# Patient Record
Sex: Male | Born: 1985 | Race: Black or African American | Hispanic: No | Marital: Married | Smoking: Never smoker
Health system: Southern US, Community
[De-identification: ages and names within clinical notes are randomized; demographics above are authoritative.]

## PROBLEM LIST (undated history)

## (undated) DIAGNOSIS — K219 Gastro-esophageal reflux disease without esophagitis: Secondary | ICD-10-CM

## (undated) HISTORY — DX: Gastro-esophageal reflux disease without esophagitis: K21.9

---
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
## 2015-12-23 ENCOUNTER — Ambulatory Visit (INDEPENDENT_AMBULATORY_CARE_PROVIDER_SITE_OTHER): Payer: 59 | Admitting: Emergency Medicine

## 2015-12-23 ENCOUNTER — Ambulatory Visit (INDEPENDENT_AMBULATORY_CARE_PROVIDER_SITE_OTHER): Payer: 59

## 2015-12-23 VITALS — BP 138/86 | HR 77 | Temp 98.3°F | Resp 18 | Ht 71.5 in | Wt 288.2 lb

## 2015-12-23 DIAGNOSIS — J069 Acute upper respiratory infection, unspecified: Secondary | ICD-10-CM

## 2015-12-23 DIAGNOSIS — J0100 Acute maxillary sinusitis, unspecified: Secondary | ICD-10-CM

## 2015-12-23 DIAGNOSIS — R059 Cough, unspecified: Secondary | ICD-10-CM

## 2015-12-23 DIAGNOSIS — E669 Obesity, unspecified: Secondary | ICD-10-CM

## 2015-12-23 DIAGNOSIS — R05 Cough: Secondary | ICD-10-CM

## 2015-12-23 LAB — POCT CBC
Granulocyte percent: 56.3 %G (ref 37–80)
HCT, POC: 46.2 % (ref 43.5–53.7)
Hemoglobin: 16.2 g/dL (ref 14.1–18.1)
Lymph, poc: 3.3 (ref 0.6–3.4)
MCH, POC: 29.8 pg (ref 27–31.2)
MCHC: 35 g/dL (ref 31.8–35.4)
MCV: 85 fL (ref 80–97)
MID (cbc): 0.3 (ref 0–0.9)
MPV: 7.3 fL (ref 0–99.8)
POC Granulocyte: 4.6 (ref 2–6.9)
POC LYMPH PERCENT: 39.8 %L (ref 10–50)
POC MID %: 3.9 %M (ref 0–12)
Platelet Count, POC: 218 10*3/uL (ref 142–424)
RBC: 5.43 M/uL (ref 4.69–6.13)
RDW, POC: 14.2 %
WBC: 8.2 10*3/uL (ref 4.6–10.2)

## 2015-12-23 LAB — GLUCOSE, POCT (MANUAL RESULT ENTRY): POC Glucose: 92 mg/dl (ref 70–99)

## 2015-12-23 MED ORDER — AMOXICILLIN-POT CLAVULANATE 875-125 MG PO TABS: 1.0000 | ORAL_TABLET | Freq: Two times a day (BID) | ORAL | Status: DC

## 2015-12-23 MED ORDER — AZELASTINE HCL 0.1 % NA SOLN: 2.0000 | Freq: Two times a day (BID) | NASAL | Status: DC

## 2015-12-23 NOTE — Progress Notes (Addendum)
Patient ID: Jack Wiley, male   DOB: 07/09/1986, 30 y.o.   MRN: [id number]     By signing my name below, I, Littie Deedsichard Sun, attest that this documentation has been prepared under the direction and in the presence of Lesle ChrisSteven Teleah Villamar, MD.  Electronically Signed: Littie Deedsichard Sun, Medical Scribe. 12/23/2015. 9:30 AM.   Chief Complaint:  Chief Complaint  Patient presents with  . Sinusitis    x1 month, facial pressure, headache, and fever.   . Cough    x1 month     HPI: Jack Wiley is a 30 y.o. male [facility] complaining of gradual onset, productive cough of yellowish sputum that started 1 month ago. Patient reports having associated sinus congestion, sinus pressure, rhinorrhea with clear to yellow/green sputum headache, nausea, vomiting, and subjective fever. He has tried Mucinex and Robitussin for his symptoms. Patient denies smoking and history of asthma. He also denies any new pets or changes in his environment. He does have a history of pneumonia. He does not have any known medical problems, although today is this first time he has seen a doctor in 6 years as he recently acquired insurance.  Patient works 3rd shift in [employer].  Past Medical History  Diagnosis Date  . GERD (gastroesophageal reflux disease)    History reviewed. No pertinent past surgical history. Social History   Social History  . Marital Status: Married    Spouse Name: N/A  . Number of Children: N/A  . Years of Education: N/A   Social History Main Topics  . Smoking status: Never Smoker   . Smokeless tobacco: None  . Alcohol Use: 0.0 oz/week    0 Standard drinks or equivalent per week  . Drug Use: No  . Sexual Activity: Not Asked   Other Topics Concern  . None   Social History Narrative  . None   History reviewed. No pertinent family history. No Known Allergies Prior to Admission medications   Not on File     ROS: The patient denies night sweats, unintentional weight loss, chest  pain, palpitations, wheezing, dyspnea on exertion, abdominal pain, dysuria, hematuria, melena, numbness, weakness, or tingling.   All other systems have been reviewed and were otherwise negative with the exception of those mentioned in the HPI and as above.    PHYSICAL EXAM: Filed Vitals:   12/23/15 0912  BP: 138/86  Pulse: 77  Temp: 98.3 F (36.8 C)  Resp: 18   Body mass index is 39.64 kg/(m^2).   General: Alert, no acute distress. Not ill appearing.  HEENT:  Normocephalic, atraumatic, oropharynx patent. Nasal congestion. Ears and throat normal. Eye: EOMI, PEERLDC Cardiovascular:  Regular rate and rhythm, no rubs murmurs or gallops.  No Carotid bruits, radial pulse intact. No pedal edema.  Respiratory: Clear to auscultation bilaterally.  No wheezes, rales, or rhonchi.  No cyanosis, no use of accessory musculature Abdominal: No organomegaly, abdomen is soft and non-tender, positive bowel sounds.  No masses. Musculoskeletal: Gait intact. No edema, tenderness Skin: No rashes. Neurologic: Facial musculature symmetric. Psychiatric: Patient acts appropriately throughout our interaction. Lymphatic: No cervical or submandibular lymphadenopathy     LABS: Results for orders placed or performed in visit on 12/23/15  POCT glucose (manual entry)  Result Value Ref Range   POC Glucose 92 70 - 99 mg/dl  POCT CBC  Result Value Ref Range   WBC 8.2 4.6 - 10.2 K/uL   Lymph, poc 3.3 0.6 - 3.4   POC LYMPH PERCENT  39.8 10 - 50 %L   MID (cbc) 0.3 0 - 0.9   POC MID % 3.9 0 - 12 %M   POC Granulocyte 4.6 2 - 6.9   Granulocyte percent 56.3 37 - 80 %G   RBC 5.43 4.69 - 6.13 M/uL   Hemoglobin 16.2 14.1 - 18.1 g/dL   HCT, POC 16.1 09.6 - 53.7 %   MCV 85.0 80 - 97 fL   MCH, POC 29.8 27 - 31.2 pg   MCHC 35.0 31.8 - 35.4 g/dL   RDW, POC 04.5 %   Platelet Count, POC 218 142 - 424 K/uL   MPV 7.3 0 - 99.8 fL     EKG/XRAY:   Primary read interpreted by Dr. Cleta Alberts at [facility]. Chest x-ray is  normal. Sinus films show an occluded right maxillary sinus.   ASSESSMENT/PLAN: I suspect symptoms are secondary to sinusitis. He was placed on Astelin nasal spray along with 2 weeks of Augmentin. Patient to return to clinic to see me if not well in 2 weeks. He will need a note for 2 days.I personally performed the services described in this documentation, which was scribed in my presence. The recorded information has been reviewed and is accurate.    Gross sideeffects, risk and benefits, and alternatives of medications d/w patient. Patient is aware that all medications have potential sideeffects and we are unable to predict every sideeffect or drug-drug interaction that may occur.  Lesle Chris MD 12/23/2015 9:30 AM

## 2015-12-23 NOTE — Patient Instructions (Signed)

## 2017-01-05 ENCOUNTER — Ambulatory Visit (HOSPITAL_COMMUNITY)
Admission: EM | Admit: 2017-01-05 | Discharge: 2017-01-05 | Disposition: A | Discharge status: Home / Self Care | Payer: 59 | Attending: Family Medicine | Admitting: Family Medicine

## 2017-01-05 ENCOUNTER — Encounter (HOSPITAL_COMMUNITY): Payer: Self-pay | Admitting: Emergency Medicine

## 2017-01-05 DIAGNOSIS — B349 Viral infection, unspecified: Secondary | ICD-10-CM

## 2017-01-05 MED ORDER — PHENYLEPHRINE-CHLORPHEN-DM 10-4-12.5 MG/5ML PO LIQD: 5.0000 mL | ORAL | 0 refills | Status: AC | PRN

## 2017-01-05 MED ORDER — TRAMADOL HCL 50 MG PO TABS: ORAL_TABLET | ORAL | 0 refills | Status: AC

## 2017-01-05 NOTE — ED Triage Notes (Signed)
[facility] with a complaint of a headache with light and sound sensitivity and general body aches x 3 days.

## 2017-01-05 NOTE — Discharge Instructions (Addendum)
Drink any fluids stay well-hydrated. Take medication as directed. These may cause drowsiness. Rest  instructions that are pre-written along with your papers.

## 2017-01-05 NOTE — ED Provider Notes (Signed)
CSN: [id number]     Arrival date & time 01/05/17  1041 History   First MD Initiated Contact with Patient 01/05/17 1102     Chief Complaint  Patient presents with  . 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 Temperature currently is 98.2. States he had a flu shot this season.      Past Medical History:  Diagnosis Date  . GERD (gastroesophageal reflux disease)    History reviewed. No pertinent surgical history. History reviewed. No pertinent family history. Social History  Substance Use Topics  . Smoking status: Never Smoker  . Smokeless tobacco: Not on file  . Alcohol use 0.0 oz/week    Review of Systems  Constitutional: Positive for activity change, fatigue and fever.  HENT: Positive for congestion, postnasal drip, rhinorrhea and sore throat.   Respiratory: Positive for cough. Negative for shortness of breath and wheezing.   Cardiovascular: Negative for chest pain.  Gastrointestinal: Negative.   Skin: Negative.     Allergies  Patient has no known allergies.  Home Medications   Prior to Admission medications   Medication Sig Start Date End Date Taking? Authorizing Provider  Phenylephrine-Chlorphen-DM 09-24-11.5 MG/5ML LIQD Take 5 mLs by mouth every 4 (four) hours as needed. 01/05/17   Hayden Rasmussen, NP  traMADol (ULTRAM) 50 MG tablet 1-2 tabs po q 6 hr prn pain Maximum dose= 8 tablets per day 01/05/17   Hayden Rasmussen, NP   Meds Ordered and Administered this Visit  Medications - No data to display  BP (!) 160/104 (BP Location: Left Arm)   Pulse 114   Temp 98.2 F (36.8 C) (Oral)   Resp 16   SpO2 97%  No data found.   Physical Exam  Constitutional: He is  oriented to person, place, and time. He appears well-developed and well-nourished. No distress.  HENT:  Head: Normocephalic and atraumatic.  Right Ear: External ear normal.  Left Ear: External ear normal.  Mouth/Throat: No oropharyngeal exudate.  Bilateral TMs are clear, normal light reflex, no erythema or bulging.  Oropharynx with minor erythema otherwise no exudates or swelling.  Eyes: EOM are normal.  Neck: Normal range of motion. Neck supple.  Cardiovascular: Normal rate, regular rhythm, normal heart sounds and intact distal pulses.   Pulmonary/Chest: Effort normal. No respiratory distress. He has no wheezes. He has no rales.  Lungs are perfectly clear. Good chest expansion good air movement. No adventitious sounds wheezes, rales. No cough spasms during inspiration.   Musculoskeletal: Normal range of motion. He exhibits no edema or deformity.  Lymphadenopathy:    He has no cervical adenopathy.  Neurological: He is alert and oriented to person, place, and time.  Skin: Skin is warm and dry. No rash noted.  Psychiatric: He has a normal mood and affect.  Nursing note and vitals reviewed.   Urgent Care Course   Clinical Course     Procedures (including critical care time)  Labs Review Labs Reviewed - No data to display  Imaging Review No results found.   Visual Acuity Review  Right Eye Distance:   Left Eye Distance:   Bilateral Distance:    Right Eye Near:   Left Eye Near:    Bilateral Near:         MDM  1. Acute viral syndrome     Drink plenty fluids stay well-hydrated. Take medication as directed. These may cause drowsiness. Rest  instructions that are pre-written along with your papers. Meds ordered this encounter  Medications  . traMADol (ULTRAM) 50 MG tablet    Sig: 1-2 tabs po q 6 hr prn pain Maximum dose= 8 tablets per day    Dispense:  15 tablet    Refill:  0    Order Specific Question:   Supervising Provider    Answer:   Linna HoffKINDL, JAMES D [phone][[id number]]   . Phenylephrine-Chlorphen-DM 09-24-11.5 MG/5ML LIQD    Sig: Take 5 mLs by mouth every 4 (four) hours as needed.    Dispense:  120 mL    Refill:  0    Order Specific Question:   Supervising Provider    Answer:   Linna HoffKINDL, JAMES D [[id number]]     Hayden Rasmussenavid Arren Laminack, NP 01/05/17 1121    Hayden Rasmussenavid Mikelle Myrick, NP 01/05/17 1149    Hayden Rasmussenavid Doylene Splinter, NP 01/05/17 1151

## 2019-06-18 ENCOUNTER — Emergency Department (HOSPITAL_COMMUNITY)
Admission: EM | Admit: 2019-06-18 | Discharge: 2019-06-18 | Disposition: A | Discharge status: Home / Self Care | Payer: Self-pay | Attending: Emergency Medicine | Admitting: Emergency Medicine

## 2019-06-18 ENCOUNTER — Other Ambulatory Visit: Payer: Self-pay

## 2019-06-18 ENCOUNTER — Encounter (HOSPITAL_COMMUNITY): Payer: Self-pay | Admitting: Emergency Medicine

## 2019-06-18 ENCOUNTER — Emergency Department (HOSPITAL_COMMUNITY): Payer: Self-pay

## 2019-06-18 DIAGNOSIS — L03115 Cellulitis of right lower limb: Secondary | ICD-10-CM | POA: Insufficient documentation

## 2019-06-18 MED ORDER — OXYCODONE-ACETAMINOPHEN 5-325 MG PO TABS
1.0000 | ORAL_TABLET | Freq: Once | ORAL | Status: AC
  Administered 2019-06-18: 1 via ORAL
  Filled 2019-06-18: qty 1

## 2019-06-18 MED ORDER — DOXYCYCLINE HYCLATE 100 MG PO CAPS: 100.0000 mg | ORAL_CAPSULE | Freq: Two times a day (BID) | ORAL | 0 refills | Status: AC

## 2019-06-18 MED ORDER — LIDOCAINE HCL 2 % IJ SOLN
10.0000 mL | Freq: Once | INTRAMUSCULAR | Status: AC
  Administered 2019-06-18: 200 mg via INTRADERMAL
  Filled 2019-06-18: qty 20

## 2019-06-18 NOTE — Discharge Instructions (Addendum)
You were given a prescription for antibiotics. Please take the antibiotic prescription fully.   Please follow up with your primary care provider within 5-7 days for re-evaluation of your symptoms. If you do not have a primary care provider, information for a healthcare clinic has been provided for you to make arrangements for follow up care.  Please return to the emergency room immediately if you experience any new or worsening symptoms or any symptoms that indicate worsening infection such as fevers, increased redness/swelling/pain, warmth, or drainage from the affected area.    

## 2019-06-18 NOTE — ED Provider Notes (Signed)
[facility] EM[facility]ENC[facility] Provider Note   CSN: [id number] Arrival date & time: 06/18/19  1223    History   Chief Complaint Chief Complaint  Patient presents with   Leg Pain    HPI Jack Wiley is a 33 y.o. male.     HPI   Patient is a [age] old male with a history of GERD who presents emergency department today complaining of redness and pain to the right lower extremity.  States he was tubing in a river yesterday when he cut his leg on either a rock or a tree branch.  He woke up this morning and had severe pain to the anterior right lower extremity.  He was seen at urgent care who did an x-ray showing a possible lucency in the soft tissue concerning for foreign body.  There was no underlying fracture.  I&D was attempted at urgent care without success of foreign body removal therefore he was sent here for further evaluation.  He denies any fevers.  Past Medical History:  Diagnosis Date   GERD (gastroesophageal reflux disease)     There are no active problems to display for this patient.   History reviewed. No pertinent surgical history.      Home Medications    Prior to Admission medications   Medication Sig Start Date End Date Taking? Authorizing Provider  doxycycline (VIBRAMYCIN) 100 MG capsule Take 1 capsule (100 mg total) by mouth 2 (two) times daily for 7 days. 06/18/19 06/25/19  Raechell Singleton S, PA-C  Phenylephrine-Chlorphen-DM 09-24-11.5 MG/5ML LIQD Take 5 mLs by mouth every 4 (four) hours as needed. 01/05/17   Hayden RasmussenMabe, David, NP  traMADol (ULTRAM) 50 MG tablet 1-2 tabs po q 6 hr prn pain Maximum dose= 8 tablets per day 01/05/17   Hayden RasmussenMabe, David, NP    Family History No family history on file.  Social History Social History   Tobacco Use   Smoking status: Never Smoker  Substance Use Topics   Alcohol use: Yes    Alcohol/week: 0.0 standard drinks   Drug use: No     Allergies   Patient has no known allergies.   Review of  Systems Review of Systems  Constitutional: Negative for fever.  Musculoskeletal:       Rle pain  Skin: Positive for color change and wound.     Physical Exam Updated Vital Signs BP (!) 148/94 (BP Location: Right Arm)    Pulse (!) 59    Temp 99 F (37.2 C) (Oral)    Resp 16    Ht 5\' 11"  (1.803 m)    Wt 113.9 kg    SpO2 100%    BMI 35.01 kg/m   Physical Exam Constitutional:      General: He is not in acute distress.    Appearance: He is well-developed.  Eyes:     Conjunctiva/sclera: Conjunctivae normal.  Cardiovascular:     Rate and Rhythm: Normal rate.  Pulmonary:     Effort: Pulmonary effort is normal.  Musculoskeletal:     Comments: TTP and swelling to the mid anterior shin.  5 cm x 4 cm area of erythema to the right lower shin with central open wound from prior I&D completed prior to arrival.  No calf tenderness or edema.  No lymphangitis.  Neurovascularly intact distally.  Skin:    General: Skin is warm and dry.  Neurological:     Mental Status: He is alert and oriented to person, place, and time.  ED Treatments / Results  Labs (all labs ordered are listed, but only abnormal results are displayed) Labs Reviewed - No data to display  EKG None  Radiology Dg Tibia/fibula Right  Result Date: 06/18/2019 CLINICAL DATA:  Pain and swelling after hitting leg upon rock EXAM: RIGHT TIBIA AND FIBULA - 2 VIEW COMPARISON:  None. FINDINGS: Frontal and lateral views were obtained. No fracture or dislocation. No abnormal periosteal reaction. Several apparent phleboliths are noted anterior to the proximal and mid tibia. No soft tissue air or radiopaque foreign body demonstrable. Joint spaces appear normal. No evident knee or ankle joint effusion. There is a spur along the anterior superior patella. IMPRESSION: No fracture or dislocation. No evident radiopaque foreign body. Presumed phleboliths anterior to the proximal and mid tibia regions. Joint spaces appear unremarkable.  Calcification along the anterior superior patella is likely due to distal quadriceps tendinosis. Electronically Signed   By: Bretta BangWilliam  Woodruff III M.D.   On: 06/18/2019 14:23    Procedures Procedures (including critical care time)  Medications Ordered in ED Medications  lidocaine (XYLOCAINE) 2 % (with pres) injection 200 mg (200 mg Intradermal Given 06/18/19 1321)  oxyCODONE-acetaminophen (PERCOCET/ROXICET) 5-325 MG per tablet 1 tablet (1 tablet Oral Given 06/18/19 1344)     Initial Impression / Assessment and Plan / ED Course  I have reviewed the triage vital signs and the nursing notes.  Pertinent labs & imaging results that were available during my care of the patient were reviewed by me and considered in my medical decision making (see chart for details).   Final Clinical Impressions(s) / ED Diagnoses   Final diagnoses:  Cellulitis of right lower extremity   Patient is a [age] old male with a history of GERD who presents emergency department today complaining of redness and pain to the right lower extremity.  States he was tubing in a river yesterday when he cut his leg on either a rock or a tree branch.  He woke up this morning and had severe pain to the anterior right lower extremity.  He was seen at urgent care who did an x-ray showing a possible lucency in the soft tissue concerning for foreign body.  There was no underlying fracture.  I&D was attempted at urgent care without success of foreign body removal therefore he was sent here for further evaluation.  He denies any fevers.  Patient afebrile with reassuring vital signs.  Nontoxic nonseptic appearing.  On exam, TTP and swelling to the mid anterior shin.  5 cm x 4 cm area of erythema to the right lower shin with central open wound from prior I&D completed prior to arrival.  No calf tenderness or edema.  No lymphangitis.  Neurovascularly intact distally.   Xray of tib/fib without fracture or dislocation. No evident radiopaque  foreign body. Presumed phleboliths anterior to the proximal and mid tibia regions. Joint spaces appear unremarkable. Calcification along the anterior superior patella is likely due to distal quadriceps tendinosis.   Discussed the results of the x-ray with the patient.  Discussed possibility of persistent foreign body, attempted to probe the wound however was unable to feel or visualize a foreign body.  Avoided further probing to avoid further tissue damage.  Will keep wound open given contamination and possible foreign body.  I pressure irrigated the wound copiously with a liter of normal saline.  Will start patient on antibiotics.  A border was drawn around the area of erythema.  Patient was advised to start antibiotics and was  advised on wound care.  Advised to follow-up and return to the ER for any worsening signs of infection.  He voices understanding of the plan and reasons to return.  All questions answered.  Patient stable for discharge.  ED Discharge Orders         Ordered    doxycycline (VIBRAMYCIN) 100 MG capsule  2 times daily     06/18/19 [address], PA-C 06/18/19 1512    Isla Pence, MD 06/18/19 1521

## 2019-06-18 NOTE — ED Triage Notes (Signed)
Pt went to the [facility]. While tubing he hit a rock with his right leg. Pt went to urgent care today because he could not walk on it d/t pain.  Pt endorses drainage from wound.

## 2019-06-18 NOTE — ED Notes (Signed)
Patient transported to X-ray 

## 2019-06-18 NOTE — ED Notes (Signed)
Patient verbalizes understanding of discharge instructions . Opportunity for questions and answers were provided . Armband removed by staff ,Pt discharged from ED. W/C  offered at D/C  and Declined W/C at D/C and was escorted to lobby by RN.  

## 2019-12-07 IMAGING — CR RIGHT TIBIA AND FIBULA - 2 VIEW
4 series · 4 of 4 positions shown · non-contrast
Comparison: None.

CLINICAL DATA: Pain and swelling after hitting leg upon rock

EXAM:
RIGHT TIBIA AND FIBULA - 2 VIEW

[tibia ap (1 of 2)]
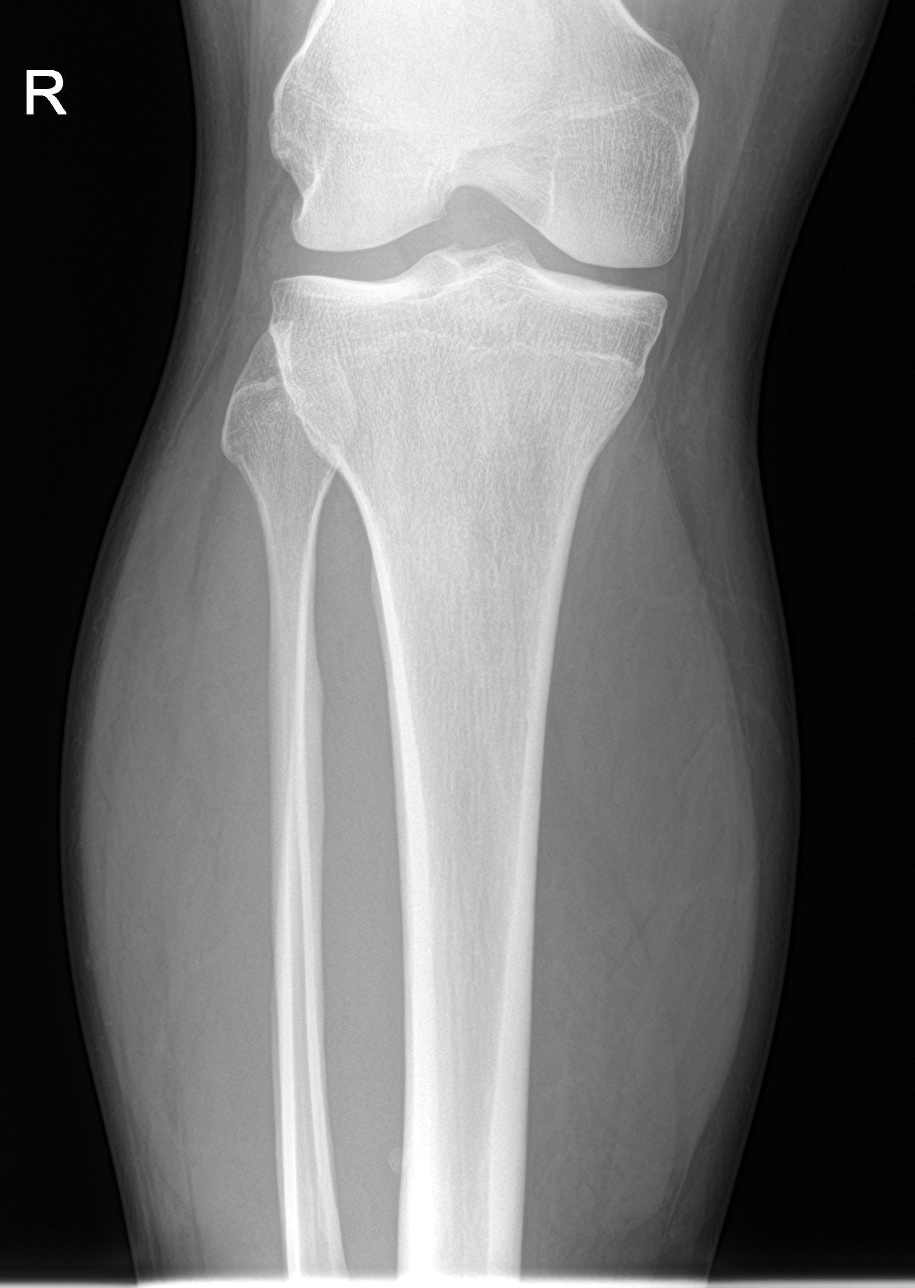

[tibia ap (2 of 2)]
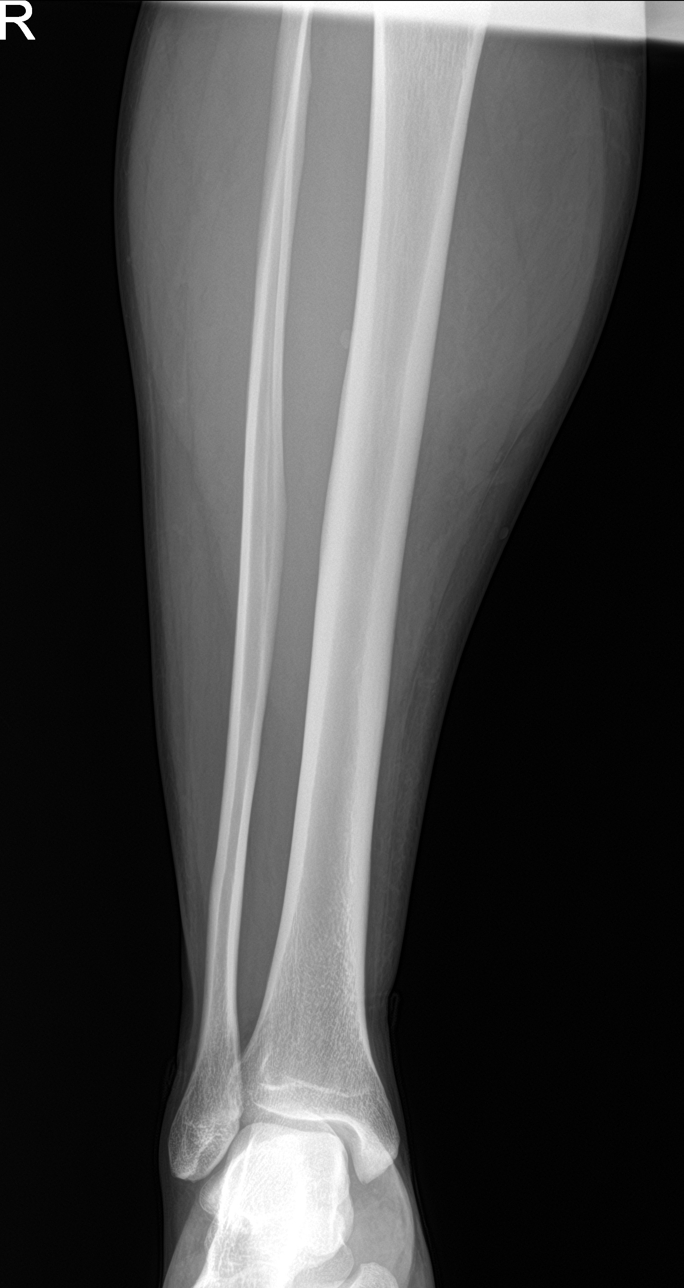

[tibia lat (1 of 2)]
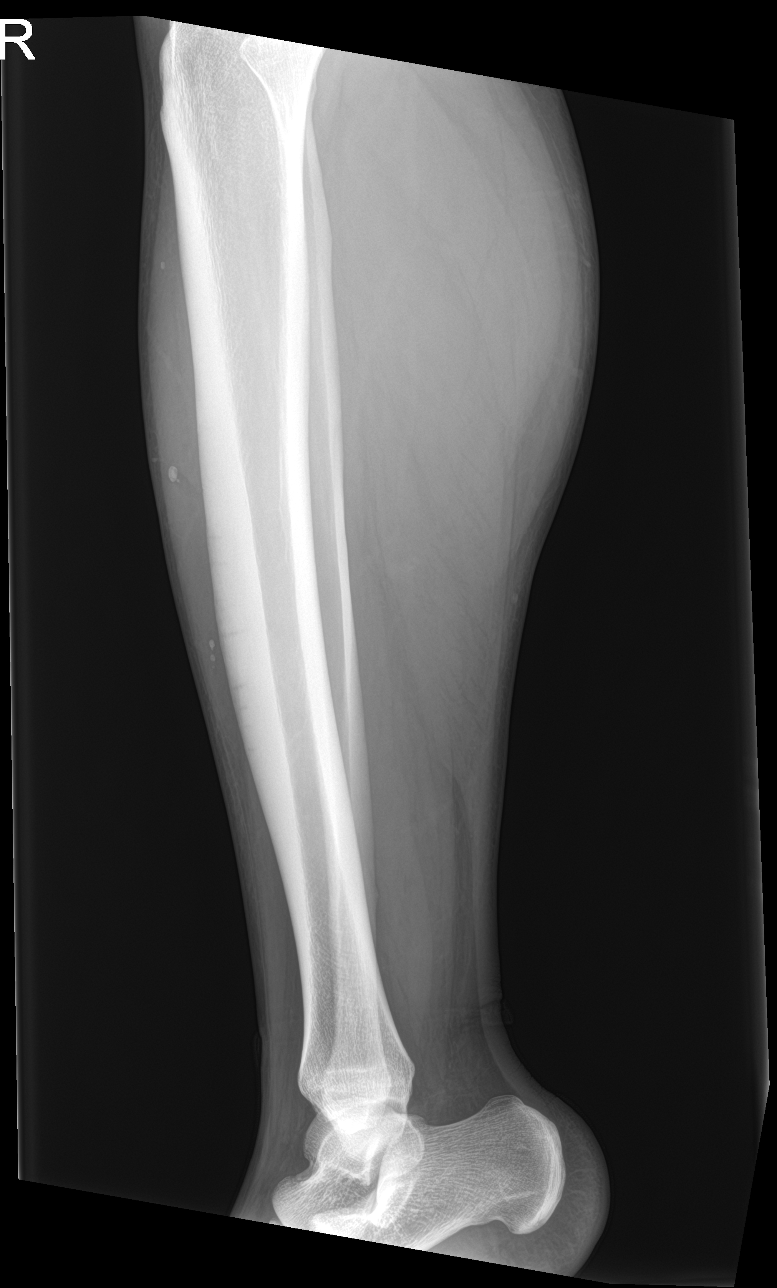

[tibia lat (2 of 2)]
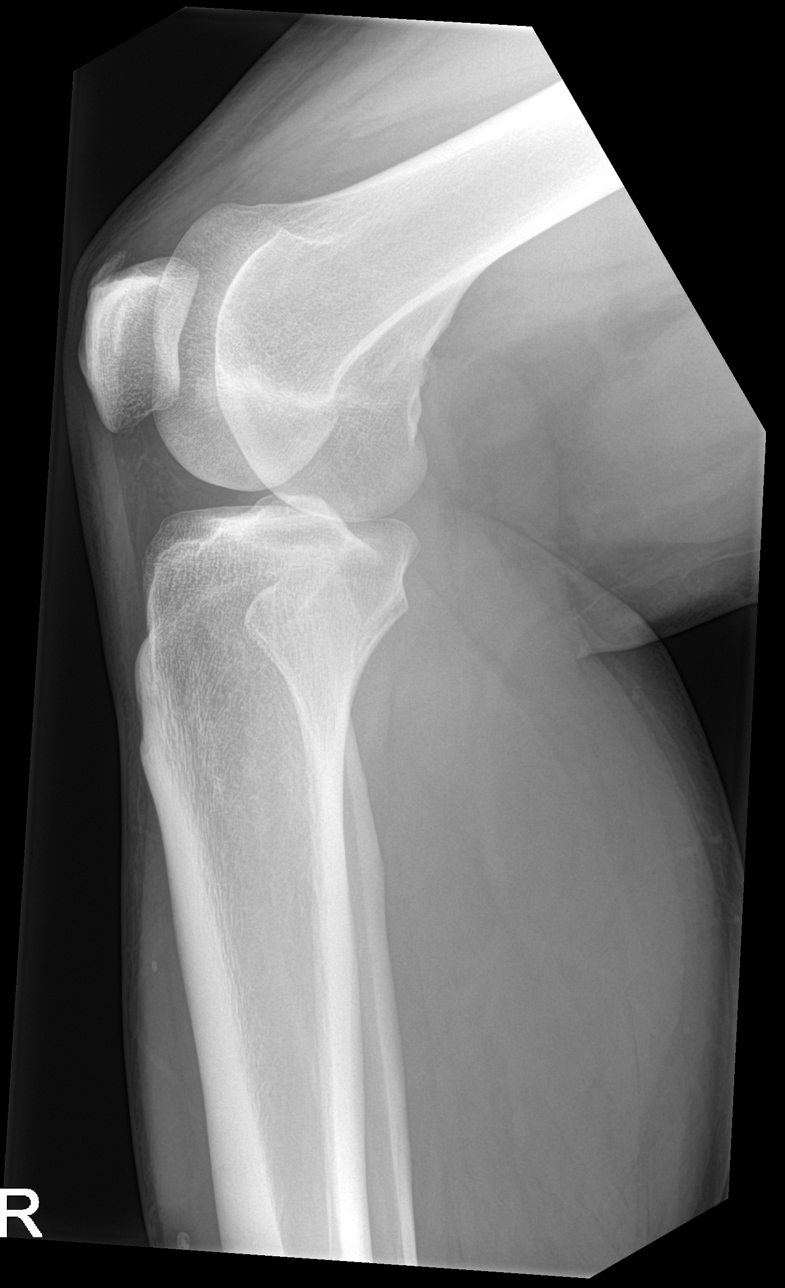

[4 of 4 positions shown; findings below may reference images not displayed]

FINDINGS: Frontal and lateral views were obtained. No fracture or dislocation.
No abnormal periosteal reaction. Several apparent phleboliths are
noted anterior to the proximal and mid tibia. No soft tissue air or
radiopaque foreign body demonstrable. Joint spaces appear normal. No
evident knee or ankle joint effusion. There is a spur along the
anterior superior patella.
IMPRESSION: No fracture or dislocation. No evident radiopaque foreign body.
Presumed phleboliths anterior to the proximal and mid tibia regions.
Joint spaces appear unremarkable. Calcification along the anterior
superior patella is likely due to distal quadriceps tendinosis.

## 2023-05-18 ENCOUNTER — Telehealth: Payer: Self-pay | Admitting: *Deleted

## 2023-05-18 NOTE — Telephone Encounter (Signed)
error
